# Patient Record
Sex: Female | Born: 1995 | Race: White | Hispanic: No | Marital: Single | Smoking: Former smoker
Health system: Southern US, Community
[De-identification: ages and names within clinical notes are randomized; demographics above are authoritative.]

## PROBLEM LIST (undated history)

## (undated) DIAGNOSIS — F411 Generalized anxiety disorder: Secondary | ICD-10-CM

---

## 2017-02-27 ENCOUNTER — Emergency Department
Admission: EM | Admit: 2017-02-27 | Discharge: 2017-02-27 | Disposition: A | Discharge status: Home / Self Care | Payer: Federal, State, Local not specified - PPO | Attending: Emergency Medicine | Admitting: Emergency Medicine

## 2017-02-27 ENCOUNTER — Emergency Department: Payer: Federal, State, Local not specified - PPO

## 2017-02-27 DIAGNOSIS — S5781XA Crushing injury of right forearm, initial encounter: Secondary | ICD-10-CM | POA: Diagnosis present

## 2017-02-27 DIAGNOSIS — Y929 Unspecified place or not applicable: Secondary | ICD-10-CM | POA: Insufficient documentation

## 2017-02-27 DIAGNOSIS — W208XXA Other cause of strike by thrown, projected or falling object, initial encounter: Secondary | ICD-10-CM | POA: Insufficient documentation

## 2017-02-27 DIAGNOSIS — Y9389 Activity, other specified: Secondary | ICD-10-CM | POA: Insufficient documentation

## 2017-02-27 DIAGNOSIS — S5011XA Contusion of right forearm, initial encounter: Secondary | ICD-10-CM | POA: Insufficient documentation

## 2017-02-27 DIAGNOSIS — Z87891 Personal history of nicotine dependence: Secondary | ICD-10-CM | POA: Insufficient documentation

## 2017-02-27 DIAGNOSIS — S40021A Contusion of right upper arm, initial encounter: Secondary | ICD-10-CM

## 2017-02-27 DIAGNOSIS — Y999 Unspecified external cause status: Secondary | ICD-10-CM | POA: Insufficient documentation

## 2017-02-27 HISTORY — DX: Generalized anxiety disorder: F41.1

## 2017-02-27 MED ORDER — KETOROLAC TROMETHAMINE 60 MG/2ML IM SOLN
30.0000 mg | Freq: Once | INTRAMUSCULAR | Status: AC
  Administered 2017-02-27: 30 mg via INTRAMUSCULAR
  Filled 2017-02-27: qty 2

## 2017-02-27 MED ORDER — IBUPROFEN 800 MG PO TABS: 800.0000 mg | ORAL_TABLET | Freq: Three times a day (TID) | ORAL | 0 refills | Status: AC | PRN

## 2017-02-27 NOTE — ED Triage Notes (Signed)
Pt reports loading a jeep onto a trailer and was pulling the ramp out when the ramp fell onto pt's right arm. Pt reports pain when opening and closing her hand. Pt able to move fingers, pulses palpable. Pt denies other injuries at this time. Pt A&O and reports hx of right arm fracture when she was 21 years old.

## 2017-02-27 NOTE — Discharge Instructions (Signed)
Please rest ice and elevate the right upper extremity. Use a sling as needed for comfort. If any increasing pain, swelling, numbness or tingling to return to the emergency Department immediately.

## 2017-02-27 NOTE — ED Provider Notes (Signed)
[facility]-EMERGENCY DEPARTMENT Provider Note   CSN: [id number] Arrival date & time: 02/27/17  2014     History   Chief Complaint Chief Complaint  Patient presents with  . Arm Pain    Right    HPI Monica Berger is a 21 y.o. female presents to the emergency department for evaluation of right arm pain. Patient was helping her father loading a vehicle onto a trailer when the ramp fell onto the patient's right arm. Patient was lying down on the ground with the volar aspect of her arm facing upward with the rent fell onto the volar aspect of her right forearm. She has pain along the proximal third of the forearm. No numbness or tingling. No swelling. She has no pain with elbow or with elbow range of motion. She has good range of motion of the wrist with no discomfort. Her pain is 8 out of 10. She has not had any medication for pain. No bleeding or skin breakdown noted.  HPI  Past Medical History:  Diagnosis Date  . Generalized anxiety disorder     There are no active problems to display for this patient.   History reviewed. No pertinent surgical history.  OB History    No data available       Home Medications    Prior to Admission medications   Medication Sig Start Date End Date Taking? Authorizing Provider  ibuprofen (ADVIL,MOTRIN) 800 MG tablet Take 1 tablet (800 mg total) by mouth every 8 (eight) hours as needed. 02/27/17   Evon SlackGaines, Gunter Conde C, PA-C    Family History No family history on file.  Social History Social History  Substance Use Topics  . Smoking status: Former Games developermoker  . Smokeless tobacco: Never Used  . Alcohol use Yes     Comment: pt states occasionally     Allergies   Patient has no known allergies.   Review of Systems Review of Systems  Constitutional: Negative for activity change, chills, fatigue and fever.  HENT: Negative for congestion, sinus pressure and sore throat.   Eyes: Negative for visual disturbance.  Respiratory: Negative for chest  tightness.   Cardiovascular: Negative for chest pain and leg swelling.  Gastrointestinal: Negative for abdominal pain and vomiting.  Genitourinary: Negative for dysuria.  Musculoskeletal: Positive for myalgias. Negative for arthralgias, gait problem and neck pain.  Skin: Negative for rash and wound.  Neurological: Negative for weakness, numbness and headaches.  Hematological: Negative for adenopathy.  Psychiatric/Behavioral: Negative for agitation, behavioral problems and confusion.     Physical Exam Updated Vital Signs BP 136/86 (BP Location: Left Arm)   Pulse 96   Temp 98.5 F (36.9 C) (Oral)   Resp 20   Ht 5\' 11"  (1.803 m)   Wt 121.6 kg   LMP 02/13/2017   SpO2 100%   BMI 37.38 kg/m   Physical Exam  Constitutional: She is oriented to person, place, and time. She appears well-developed and well-nourished. No distress.  HENT:  Head: Normocephalic and atraumatic.  Mouth/Throat: Oropharynx is clear and moist.  Eyes: EOM are normal. Pupils are equal, round, and reactive to light. Right eye exhibits no discharge. Left eye exhibits no discharge.  Neck: Normal range of motion. Neck supple.  Cardiovascular: Normal rate and intact distal pulses.   Pulmonary/Chest: Effort normal. No respiratory distress.  Musculoskeletal:  Examination of the right upper extremity shows patient has full range of motion of the shoulder or elbow wrist and digits. There is mild ecchymosis along the proximal volar  aspect of the right forearm with no skin breakdown noted. There is no significant swelling and no edema noted. She has no pain with elbow or wrist range of motion. Some mild pain with pronation and supination of the forearm. She has no pain with resisted wrist flexion or extension. She is neurovascular intact in right upper extremity.  Neurological: She is alert and oriented to person, place, and time. She has normal reflexes.  Skin: Skin is warm and dry.  Psychiatric: She has a normal mood and  affect. Her behavior is normal. Thought content normal.     ED Treatments / Results  Labs (all labs ordered are listed, but only abnormal results are displayed) Labs Reviewed - No data to display  EKG  EKG Interpretation None       Radiology Dg Forearm Right  Result Date: 02/27/2017 CLINICAL DATA:  Acute right forearm pain following blunt force injury. Initial encounter. EXAM: RIGHT FOREARM - 2 VIEW COMPARISON:  None. FINDINGS: There is no evidence of fracture or other focal bone lesions. Soft tissues are unremarkable. IMPRESSION: Negative. Electronically Signed   By: Harmon Pier M.D.   On: 02/27/2017 21:14    Procedures Procedures (including critical care time)  Medications Ordered in ED Medications  ketorolac (TORADOL) injection 30 mg (30 mg Intramuscular Given 02/27/17 2100)     Initial Impression / Assessment and Plan / ED Course  I have reviewed the triage vital signs and the nursing notes.  Pertinent labs & imaging results that were available during my care of the patient were reviewed by me and considered in my medical decision making (see chart for details).     21 year old female with right forearm contusion. No sign of compartment syndrome. She is neurovascularly intact. X-rays are negative for any acute bony abnormality. She is placed into a sling. She is educated on signs and symptoms to return to the ED for. Ibuprofen as needed for pain.  Final Clinical Impressions(s) / ED Diagnoses   Final diagnoses:  Arm contusion, right, initial encounter    New Prescriptions New Prescriptions   IBUPROFEN (ADVIL,MOTRIN) 800 MG TABLET    Take 1 tablet (800 mg total) by mouth every 8 (eight) hours as needed.     Evon Slack, PA-C 02/27/17 2119    Evon Slack, PA-C 02/27/17 2120    Darci Current, MD 02/27/17 [phone]

## 2017-12-06 IMAGING — DX DG FOREARM 2V*R*
2 series · 2 of 2 positions shown · non-contrast
Comparison: None.

CLINICAL DATA: Acute right forearm pain following blunt force
injury. Initial encounter.

EXAM:
RIGHT FOREARM - 2 VIEW

[forearm ap]
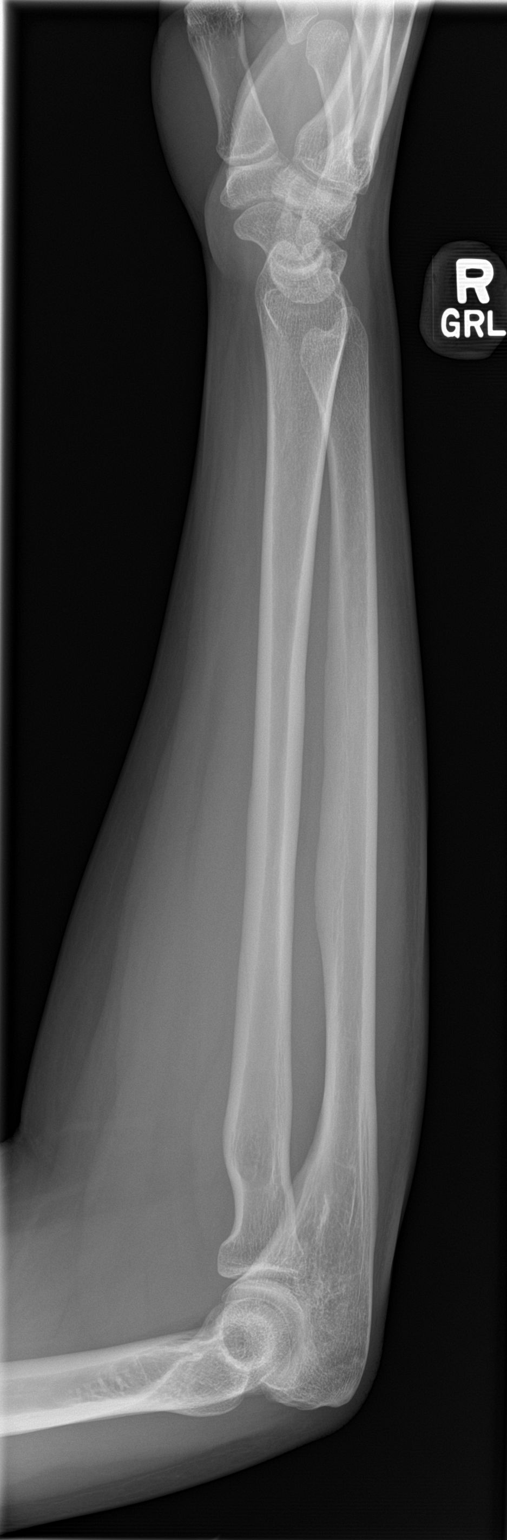

[forearm lat]
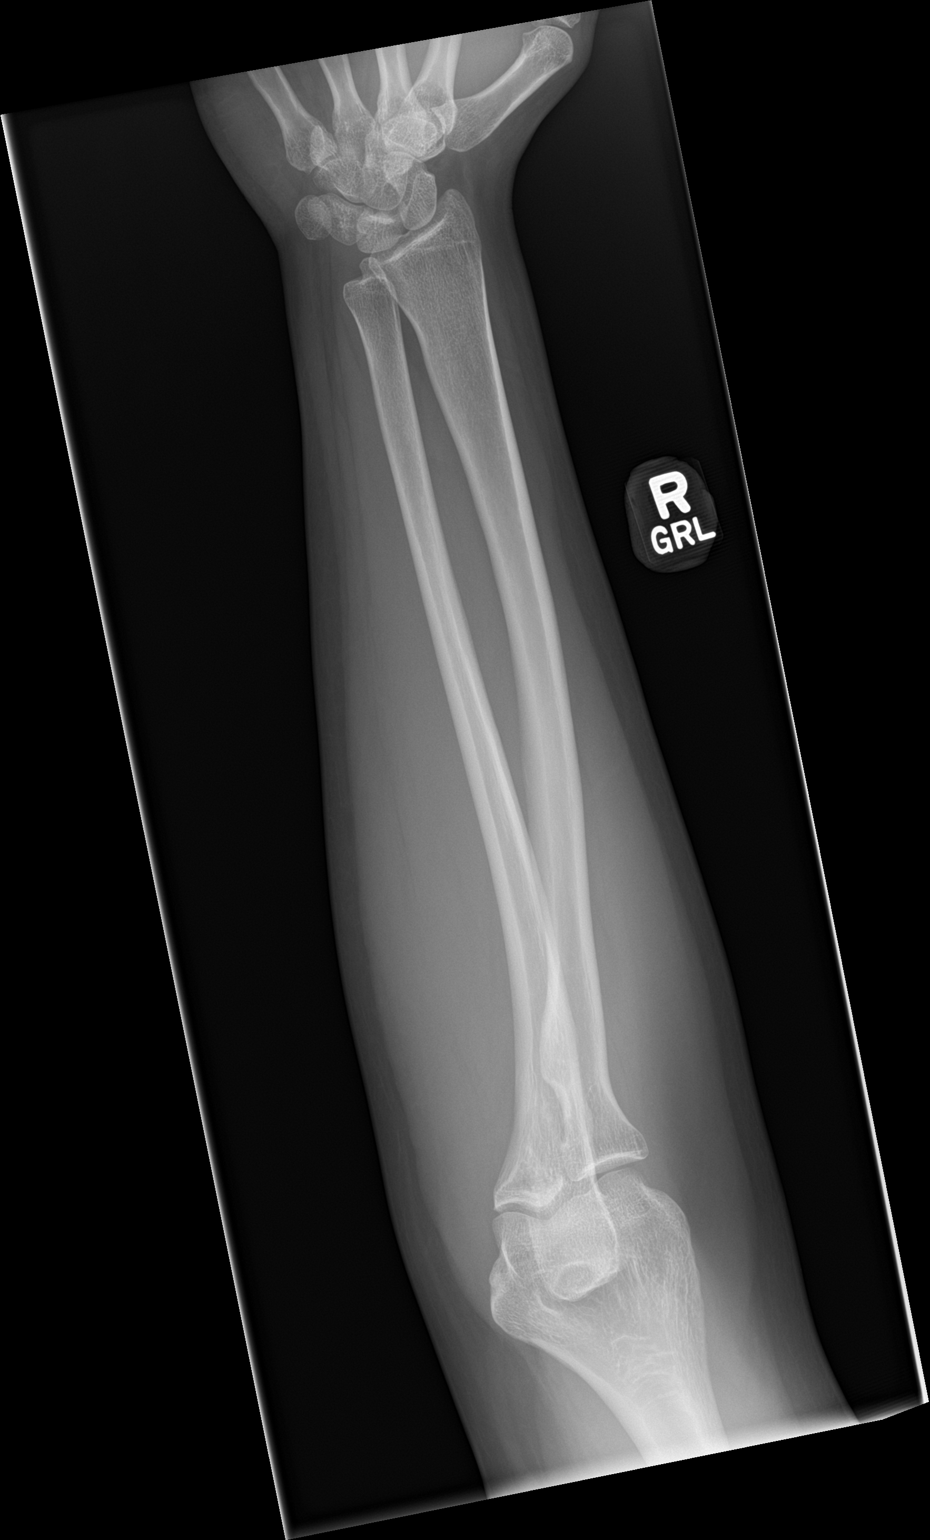

[2 of 2 positions shown; findings below may reference images not displayed]

FINDINGS: There is no evidence of fracture or other focal bone lesions. Soft
tissues are unremarkable.
IMPRESSION: Negative.
